# Patient Record
Sex: Female | Born: 1980 | Race: White | Hispanic: No | Smoking: Current every day smoker
Health system: Southern US, Community
[De-identification: ages and names within clinical notes are randomized; demographics above are authoritative.]

---

## 2007-05-28 ENCOUNTER — Observation Stay: Payer: Self-pay

## 2007-05-29 ENCOUNTER — Inpatient Hospital Stay: Payer: Self-pay | Admitting: Obstetrics and Gynecology

## 2009-11-11 ENCOUNTER — Emergency Department: Payer: Self-pay | Admitting: Emergency Medicine

## 2011-01-25 ENCOUNTER — Emergency Department: Payer: Self-pay | Admitting: Emergency Medicine

## 2013-01-24 IMAGING — CR RIGHT ANKLE - COMPLETE 3+ VIEW
1 series · 5 of 5 positions shown · non-contrast
Comparison: none

REASON FOR EXAM: fall
COMMENTS:

[Series 1: x ankle ap right · 0.14mm/px · 5 of 5 slices shown]
[im 1/5]
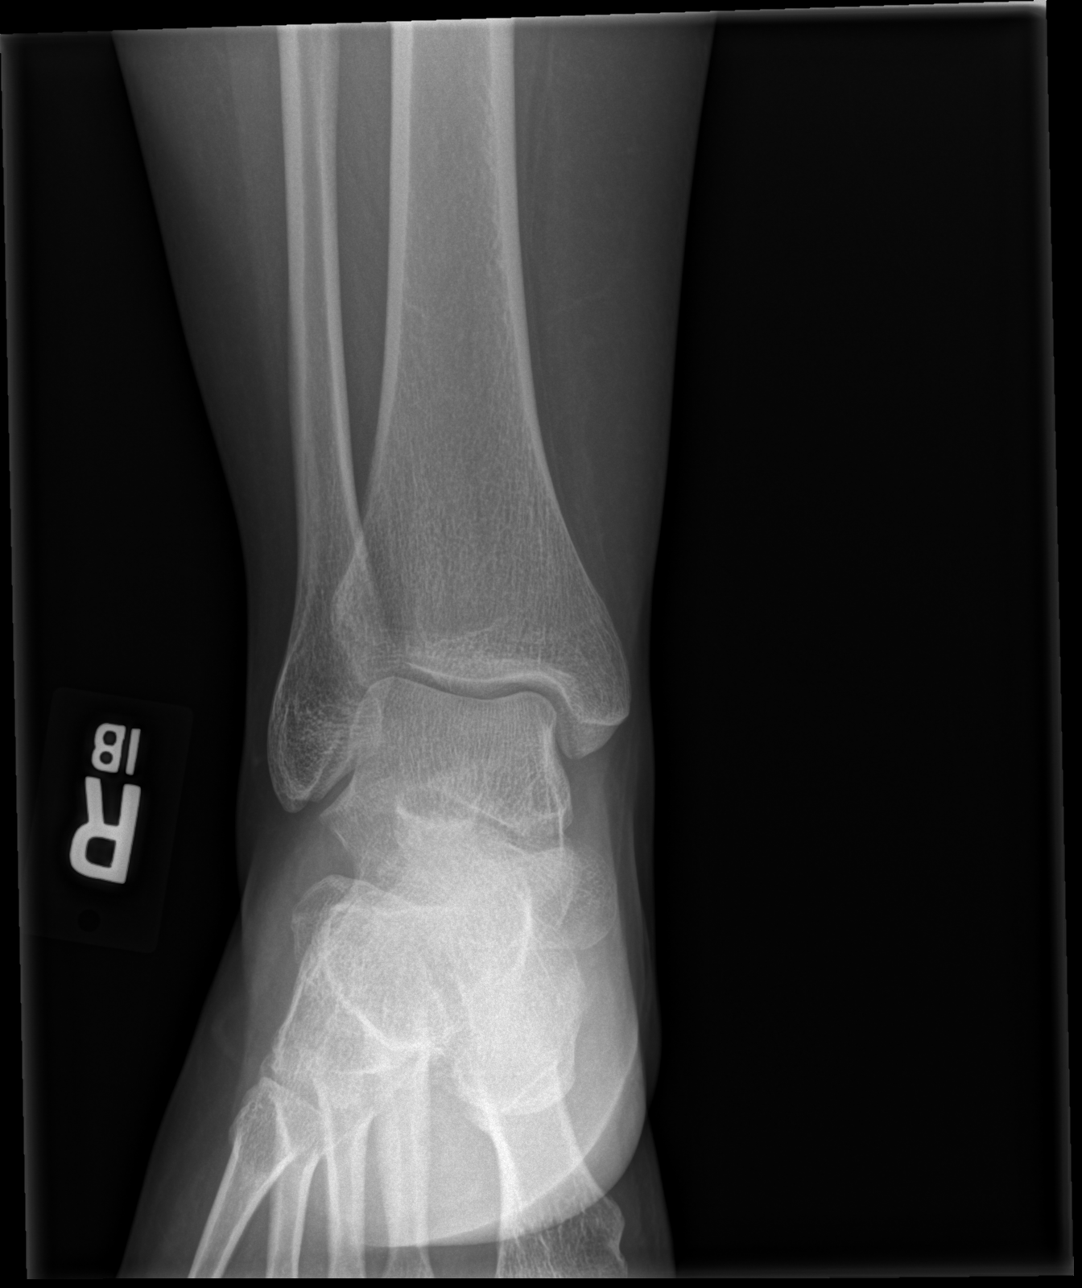
[im 2/5]
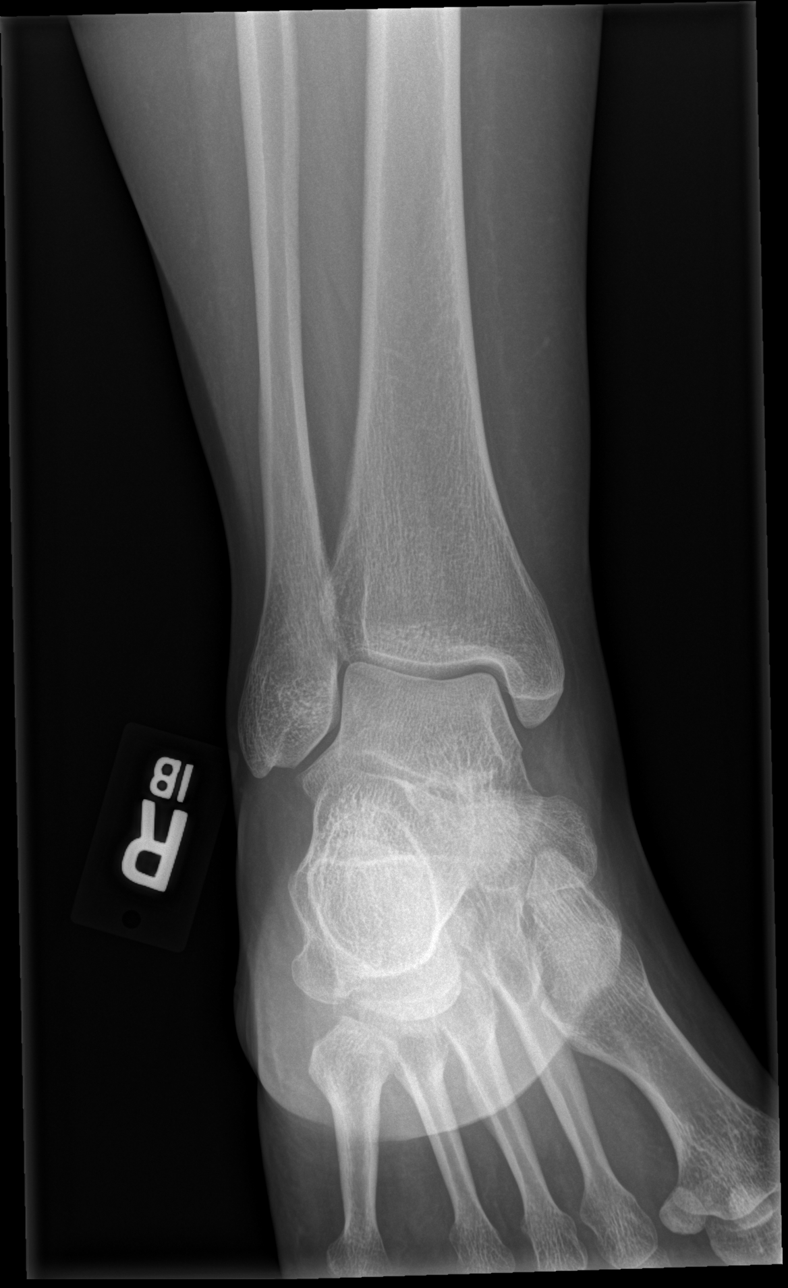
[im 3/5]
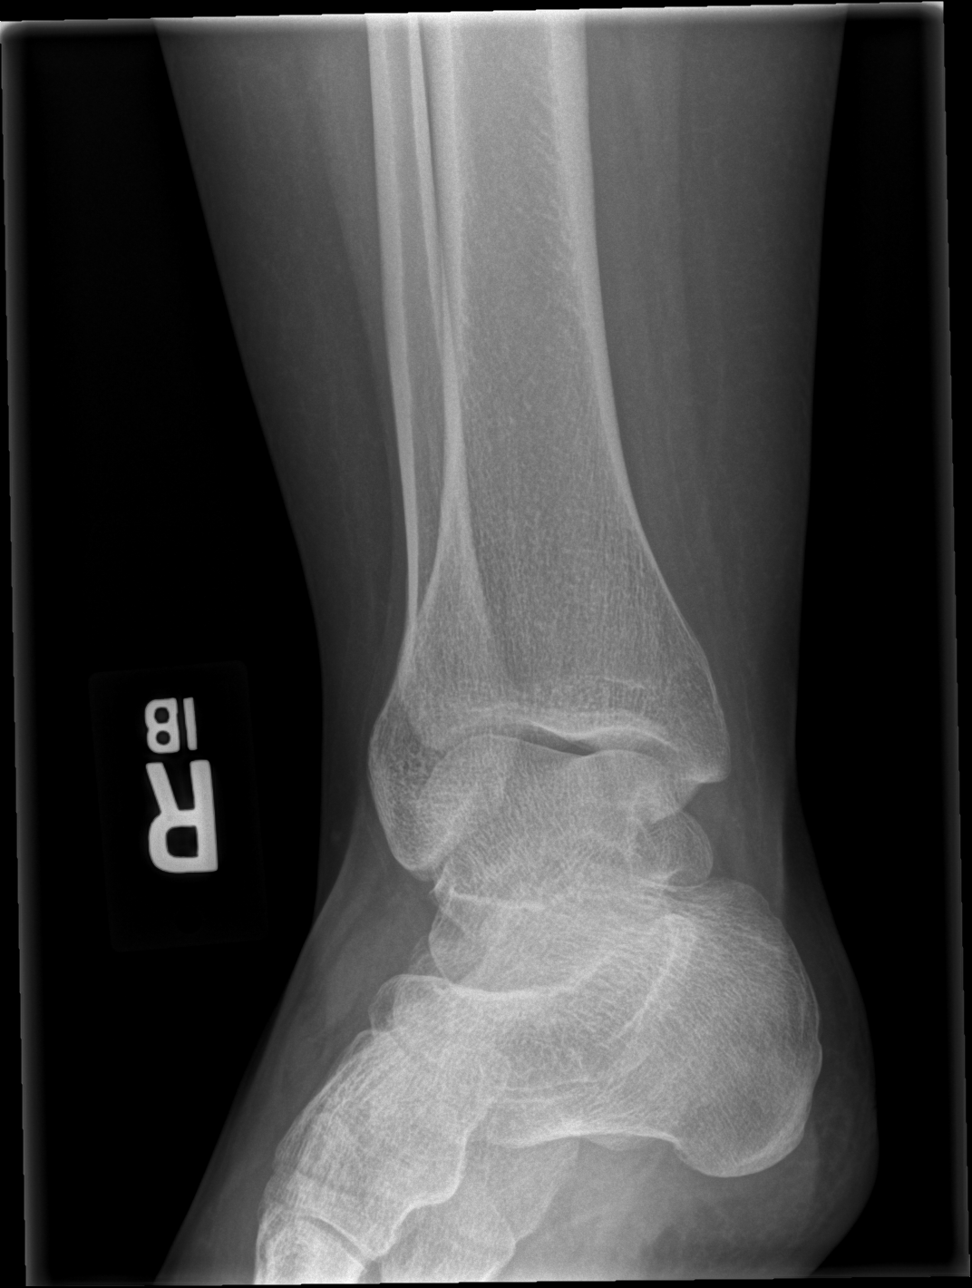
[im 4/5]
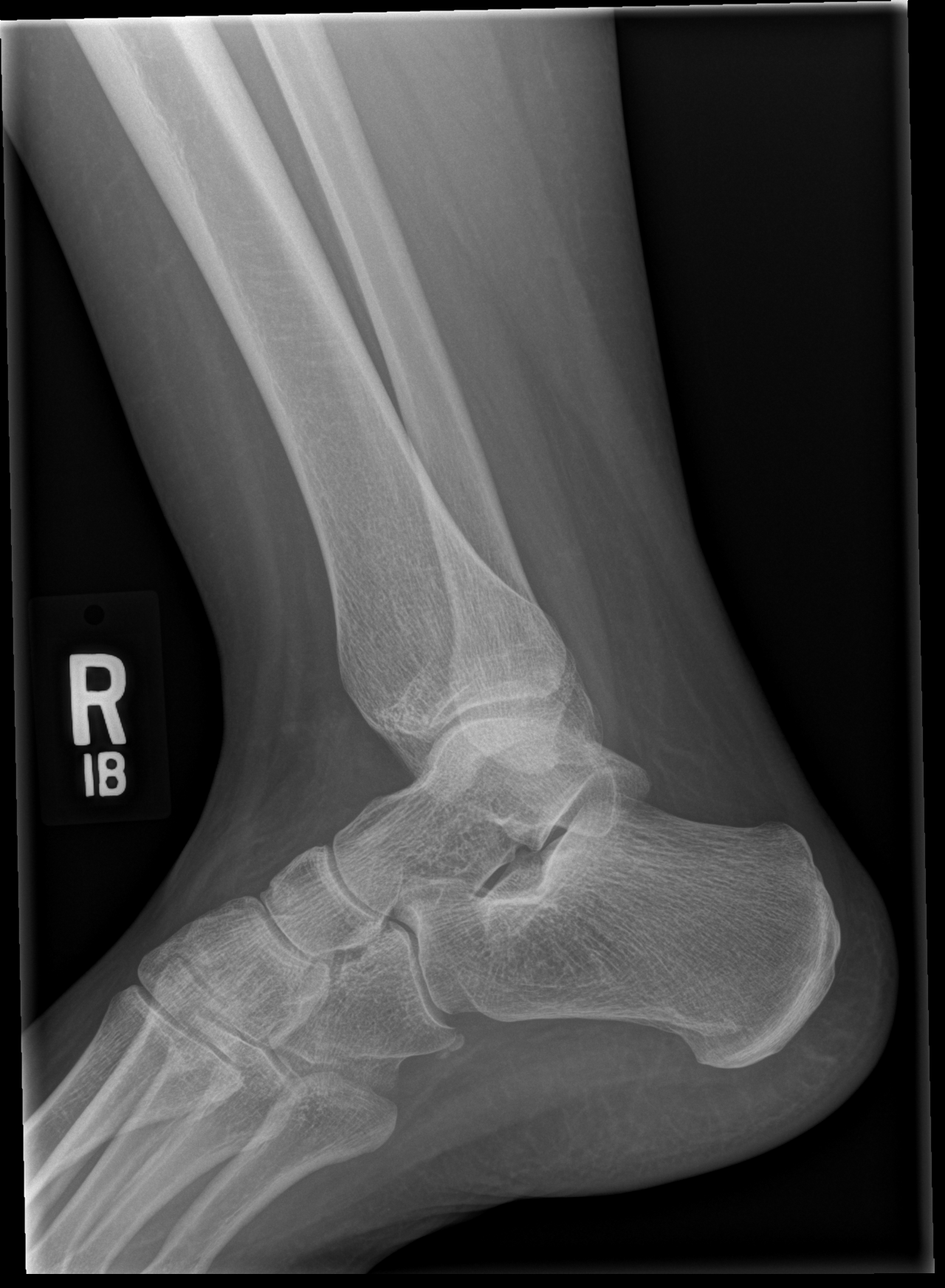
[im 5/5]
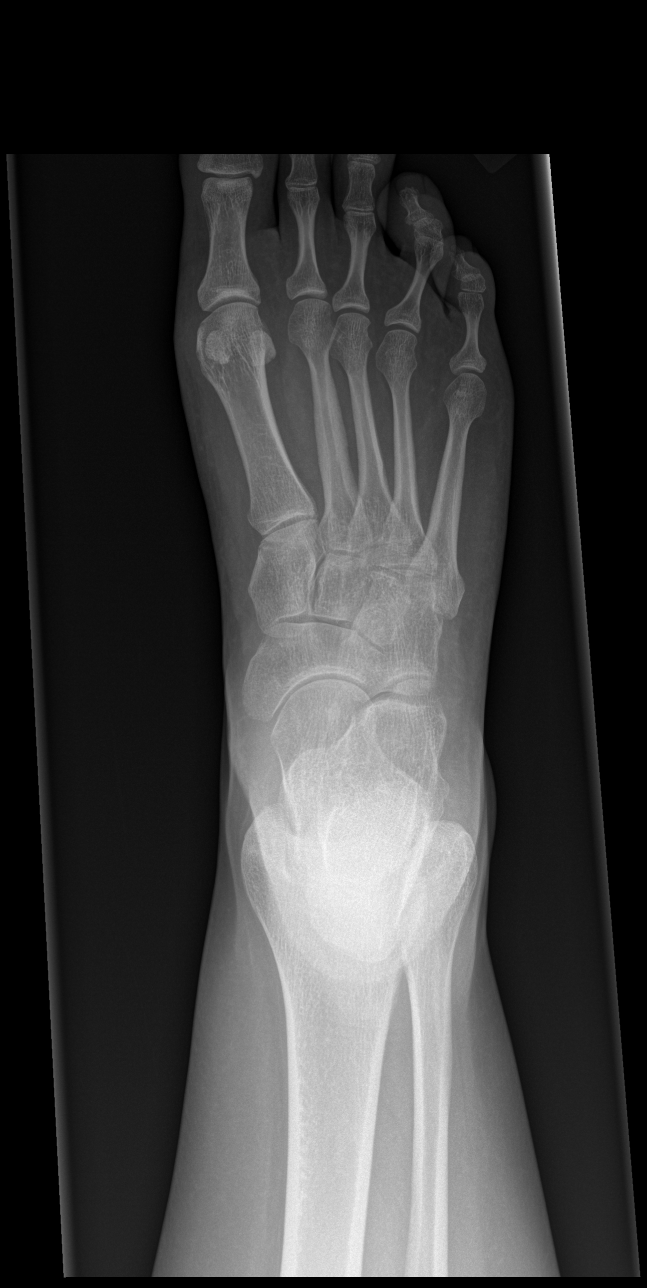

[5 of 5 positions shown; findings below may reference images not displayed]

PROCEDURE:     DXR - DXR ANKLE RIGHT COMPLETE  - January 25, 2011  [DATE]

RESULT:     Five views of the right ankle reveal the bones to be reasonably
well mineralized. The ankle joint mortise is preserved. The talar dome is
intact. I do not see evidence of an acute malleolar fracture. The
metatarsals appear intact.
IMPRESSION: I see no acute bony abnormality of the right ankle.

## 2013-03-11 ENCOUNTER — Encounter (HOSPITAL_COMMUNITY): Payer: Self-pay | Admitting: Emergency Medicine

## 2013-03-11 ENCOUNTER — Emergency Department (HOSPITAL_COMMUNITY)
Admission: EM | Admit: 2013-03-11 | Discharge: 2013-03-11 | Disposition: A | Discharge status: Home / Self Care | Payer: PRIVATE HEALTH INSURANCE | Attending: Emergency Medicine | Admitting: Emergency Medicine

## 2013-03-11 DIAGNOSIS — F17200 Nicotine dependence, unspecified, uncomplicated: Secondary | ICD-10-CM | POA: Insufficient documentation

## 2013-03-11 DIAGNOSIS — N390 Urinary tract infection, site not specified: Secondary | ICD-10-CM | POA: Insufficient documentation

## 2013-03-11 DIAGNOSIS — Z3202 Encounter for pregnancy test, result negative: Secondary | ICD-10-CM | POA: Insufficient documentation

## 2013-03-11 LAB — URINALYSIS, ROUTINE W REFLEX MICROSCOPIC
Bilirubin Urine: NEGATIVE
Glucose, UA: NEGATIVE mg/dL
Ketones, ur: NEGATIVE mg/dL
NITRITE: NEGATIVE
Specific Gravity, Urine: 1.025 (ref 1.005–1.030)
UROBILINOGEN UA: 0.2 mg/dL (ref 0.0–1.0)
pH: 6.5 (ref 5.0–8.0)

## 2013-03-11 LAB — POCT PREGNANCY, URINE: Preg Test, Ur: NEGATIVE

## 2013-03-11 LAB — URINE MICROSCOPIC-ADD ON

## 2013-03-11 MED ORDER — CEPHALEXIN 500 MG PO CAPS: 500.0000 mg | ORAL_CAPSULE | Freq: Four times a day (QID) | ORAL | Status: AC

## 2013-03-11 MED ORDER — HYDROCODONE-ACETAMINOPHEN 5-325 MG PO TABS: ORAL_TABLET | ORAL | Status: AC

## 2013-03-11 MED ORDER — HYDROCODONE-ACETAMINOPHEN 5-325 MG PO TABS
1.0000 | ORAL_TABLET | Freq: Once | ORAL | Status: AC
  Administered 2013-03-11: 1 via ORAL
  Filled 2013-03-11: qty 1

## 2013-03-11 MED ORDER — CEPHALEXIN 500 MG PO CAPS
500.0000 mg | ORAL_CAPSULE | Freq: Once | ORAL | Status: AC
  Administered 2013-03-11: 500 mg via ORAL
  Filled 2013-03-11: qty 1

## 2013-03-11 NOTE — ED Notes (Signed)
Pt c/o pain in lower back x 3 or 4 days.  Denies any injury.

## 2013-03-11 NOTE — Discharge Instructions (Signed)
Urinary Tract Infection °A urinary tract infection (UTI) can occur any place along the urinary tract. The tract includes the kidneys, ureters, bladder, and urethra. A type of germ called bacteria often causes a UTI. UTIs are often helped with antibiotic medicine.  °HOME CARE  °· If given, take antibiotics as told by your doctor. Finish them even if you start to feel better. °· Drink enough fluids to keep your pee (urine) clear or pale yellow. °· Avoid tea, drinks with caffeine, and bubbly (carbonated) drinks. °· Pee often. Avoid holding your pee in for a long time. °· Pee before and after having sex (intercourse). °· Wipe from front to back after you poop (bowel movement) if you are a woman. Use each tissue only once. °GET HELP RIGHT AWAY IF:  °· You have back pain. °· You have lower belly (abdominal) pain. °· You have chills. °· You feel sick to your stomach (nauseous). °· You throw up (vomit). °· Your burning or discomfort with peeing does not go away. °· You have a fever. °· Your symptoms are not better in 3 days. °MAKE SURE YOU:  °· Understand these instructions. °· Will watch your condition. °· Will get help right away if you are not doing well or get worse. °Document Released: 08/10/2007 Document Revised: 11/16/2011 Document Reviewed: 09/22/2011 °[employer]® Patient Information ©2014 [employer]. ° °

## 2013-03-11 NOTE — ED Notes (Signed)
nad noted prior to dc. Dc instructions reviewed with pt and 2 scripts given. Ambulated out without difficulty.

## 2013-03-11 NOTE — ED Provider Notes (Signed)
CSN: [id number]     Arrival date & time 03/11/13  0830 History   First MD Initiated Contact with Patient 03/11/13 [phone]     Chief Complaint  Patient presents with  . Back Pain   (Consider location/radiation/quality/duration/timing/severity/associated sxs/prior Treatment) Patient is a 33 y.o. female presenting with back pain. The history is provided by the patient.  Back Pain Location:  Lumbar spine Quality:  Aching Radiates to:  Does not radiate Pain severity:  Moderate Pain is:  Same all the time Onset quality:  Gradual Duration:  4 days Timing:  Constant Progression:  Unchanged Chronicity:  New Context: not falling, not lifting heavy objects, not recent illness and not recent injury   Context comment:  No known injury or heavy lifting Relieved by:  Nothing Worsened by:  Bending, twisting, standing and movement Ineffective treatments:  Heating pad and NSAIDs Associated symptoms: no abdominal pain, no abdominal swelling, no bladder incontinence, no chest pain, no dysuria, no fever, no headaches, no leg pain, no numbness, no paresthesias, no pelvic pain, no perianal numbness, no tingling and no weakness     History reviewed. No pertinent past medical history. History reviewed. No pertinent past surgical history. No family history on file. History  Substance Use Topics  . Smoking status: Current Every Day Smoker  . Smokeless tobacco: Not on file  . Alcohol Use: No   OB History   Grav Para Term Preterm Abortions TAB SAB Ect Mult Living                 Review of Systems  Constitutional: Negative for fever.  Respiratory: Negative for shortness of breath.   Cardiovascular: Negative for chest pain.  Gastrointestinal: Negative for vomiting, abdominal pain and constipation.  Genitourinary: Negative for bladder incontinence, dysuria, frequency, hematuria, flank pain, decreased urine volume, vaginal bleeding, vaginal discharge, difficulty urinating and pelvic pain.       No  perineal numbness or incontinence of urine or feces  Musculoskeletal: Positive for back pain. Negative for joint swelling.  Skin: Negative for rash.  Neurological: Negative for tingling, weakness, numbness, headaches and paresthesias.  All other systems reviewed and are negative.    Allergies  Review of patient's allergies indicates no known allergies.  Home Medications  No current outpatient prescriptions on file. BP 112/78  Pulse 68  Temp(Src) 97.9 F (36.6 C) (Oral)  Resp 20  Ht 5\' 8"  (1.727 m)  Wt 160 lb (72.576 kg)  BMI 24.33 kg/m2  SpO2 100%  LMP 02/26/2013 Physical Exam  Nursing note and vitals reviewed. Constitutional: She is oriented to person, place, and time. She appears well-developed and well-nourished. No distress.  HENT:  Head: Normocephalic and atraumatic.  Neck: Normal range of motion. Neck supple.  Cardiovascular: Normal rate, regular rhythm, normal heart sounds and intact distal pulses.   No murmur heard. Pulmonary/Chest: Effort normal and breath sounds normal. No respiratory distress. She exhibits no tenderness.  Abdominal: Soft. Normal appearance. She exhibits no distension. There is no hepatosplenomegaly. There is no tenderness. There is no rebound, no guarding and no CVA tenderness.  Musculoskeletal: Normal range of motion. She exhibits tenderness. She exhibits no edema.       Lumbar back: She exhibits tenderness and pain. She exhibits normal range of motion, no swelling, no deformity, no laceration and normal pulse.       Back:  ttp of the lumbar spine and paraspinal muscles.    DP pulses are brisk and symmetrical.  Distal sensation intact.  Hip  Flexors/Extensors are intact  Neurological: She is alert and oriented to person, place, and time. She has normal strength. No sensory deficit. She exhibits normal muscle tone. Coordination and gait normal.  Reflex Scores:      Patellar reflexes are 2+ on the right side and 2+ on the left side.      Achilles  reflexes are 2+ on the right side and 2+ on the left side. Skin: Skin is warm and dry. No rash noted.    ED Course  Procedures (including critical care time) Labs Review Labs Reviewed  URINALYSIS, ROUTINE W REFLEX MICROSCOPIC - Abnormal; Notable for the following:    APPearance CLOUDY (*)    Hgb urine dipstick TRACE (*)    Protein, ur TRACE (*)    Leukocytes, UA SMALL (*)    All other components within normal limits  URINE MICROSCOPIC-ADD ON - Abnormal; Notable for the following:    Squamous Epithelial / LPF MANY (*)    Bacteria, UA MANY (*)    All other components within normal limits  URINE CULTURE  POCT PREGNANCY, URINE   Imaging Review No results found.  EKG Interpretation   None       MDM    Urine culture pending  Diffuse ttp of the lumbar spine and paraspinal muscles.  No focal neuro deficits on exam.  Ambulates with a steady gait.   No concerning sx's for emergent neurological process, kidney stone or pyelonephritis..  Pt has many bacteria and small leukocytes on U/A, will treat with keflex and vicodin #12.  Pt agrees to continue ibuprofen and f/u with PMD.  VSS.  She appears stable for d/c.  Johnhenry Tippin L. Trisha Mangle, PA-C 03/12/13 1246

## 2013-03-12 LAB — URINE CULTURE: Colony Count: 70000

## 2013-03-12 NOTE — ED Provider Notes (Signed)
Medical screening examination/treatment/procedure(s) were performed by non-physician practitioner and as supervising physician I was immediately available for consultation/collaboration.  EKG Interpretation   None         Hilma Steinhilber L Shawneen Deetz, MD 03/12/13 1615
